# Patient Record
Sex: Female | Born: 1995 | Race: White | Hispanic: No | Marital: Single | Smoking: Never smoker
Health system: Southern US, Community
[De-identification: ages and names within clinical notes are randomized; demographics above are authoritative.]

## PROBLEM LIST (undated history)

## (undated) DIAGNOSIS — F909 Attention-deficit hyperactivity disorder, unspecified type: Secondary | ICD-10-CM

## (undated) DIAGNOSIS — F419 Anxiety disorder, unspecified: Secondary | ICD-10-CM

## (undated) HISTORY — DX: Anxiety disorder, unspecified: F41.9

## (undated) HISTORY — PX: CLEFT LIP REPAIR: SUR1164

## (undated) HISTORY — DX: Attention-deficit hyperactivity disorder, unspecified type: F90.9

---
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
## 2019-11-06 ENCOUNTER — Other Ambulatory Visit: Payer: Self-pay

## 2019-11-06 ENCOUNTER — Emergency Department (HOSPITAL_BASED_OUTPATIENT_CLINIC_OR_DEPARTMENT_OTHER): Payer: BC Managed Care – PPO

## 2019-11-06 ENCOUNTER — Emergency Department (INDEPENDENT_AMBULATORY_CARE_PROVIDER_SITE_OTHER): Payer: BC Managed Care – PPO

## 2019-11-06 ENCOUNTER — Encounter: Payer: Self-pay | Admitting: Emergency Medicine

## 2019-11-06 ENCOUNTER — Emergency Department (INDEPENDENT_AMBULATORY_CARE_PROVIDER_SITE_OTHER)
Admission: EM | Admit: 2019-11-06 | Discharge: 2019-11-06 | Disposition: A | Discharge status: Home / Self Care | Payer: Worker's Compensation | Source: Home / Self Care

## 2019-11-06 DIAGNOSIS — W01198A Fall on same level from slipping, tripping and stumbling with subsequent striking against other object, initial encounter: Secondary | ICD-10-CM

## 2019-11-06 DIAGNOSIS — W010XXA Fall on same level from slipping, tripping and stumbling without subsequent striking against object, initial encounter: Secondary | ICD-10-CM

## 2019-11-06 DIAGNOSIS — Y990 Civilian activity done for income or pay: Secondary | ICD-10-CM

## 2019-11-06 DIAGNOSIS — M25422 Effusion, left elbow: Secondary | ICD-10-CM

## 2019-11-06 DIAGNOSIS — M25522 Pain in left elbow: Secondary | ICD-10-CM | POA: Diagnosis not present

## 2019-11-06 DIAGNOSIS — S59902A Unspecified injury of left elbow, initial encounter: Secondary | ICD-10-CM

## 2019-11-06 MED ORDER — HYDROCODONE-ACETAMINOPHEN 5-325 MG PO TABS: 1.0000 | ORAL_TABLET | Freq: Four times a day (QID) | ORAL | 0 refills | Status: AC | PRN

## 2019-11-06 NOTE — Discharge Instructions (Signed)
  Norco/Vicodin (hydrocodone-acetaminophen) is a narcotic pain medication, do not combine these medications with others containing tylenol. While taking, do not drink alcohol, drive, or perform any other activities that requires focus while taking these medications.   Call to schedule a follow up appointment with Employee Health and Wellness if required by your employer for work related injury. It is recommended you schedule a follow up appointment with Sports Medicine or an Orthopedist in 7-10 day for repeat imaging of your elbow to help determine the severity of your injury.

## 2019-11-06 NOTE — ED Triage Notes (Signed)
Fell at work at 2130 on left arm - pain to left elbow Tylenol at 1100 (1000mg ) Pain also has pain to left FA Limited ROM

## 2019-11-06 NOTE — ED Provider Notes (Signed)
[facility]    CSN: [id number] Arrival date & time: 11/06/19  1109      History   Chief Complaint Chief Complaint  Patient presents with  . Appointment  . Arm Injury    left    HPI Kristin Washington is a 24 y.o. female.   HPI Kristin Washington is a 24 y.o. female presenting to UC with c/o Left elbow pain and swelling with slight decreased ROM after slip and fall at work last night around 9:30PM, hitting her elbow on a stone step.  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  Pain is aching and throbbing, 3/10.  She is Right hand dominant. Mild neck soreness from the fall but denies hitting her head or LOC.     Past Medical History:  Diagnosis Date  . ADHD (attention deficit hyperactivity disorder)   . Anxiety   . Migraine     Patient Active Problem List   Diagnosis Date Noted  . GAD (generalized anxiety disorder) 02/05/2011  . Obsessive-compulsive behavior 01/30/2011  . Adjustment disorder with disturbance of conduct 01/30/2011    Past Surgical History:  Procedure Laterality Date  . CLEFT LIP REPAIR      OB History   No obstetric history on file.      Home Medications    Prior to Admission medications   Medication Sig Start Date End Date Taking? Authorizing Provider  esomeprazole (NEXIUM) 20 MG capsule Take 20 mg by mouth daily at 12 noon.   Yes [provider]  amphetamine-dextroamphetamine (ADDERALL XR) 10 MG 24 hr capsule Take 1 capsule (10 mg total) by mouth every morning. 09/03/12   Elaina Pattee, MD  amphetamine-dextroamphetamine (ADDERALL XR) 10 MG 24 hr capsule Take 1 capsule (10 mg total) by mouth every morning. 09/27/12   Elaina Pattee, MD  amphetamine-dextroamphetamine (ADDERALL XR) 10 MG 24 hr capsule Take 1 capsule (10 mg total) by mouth every morning. Fill after 10/27/12 10/27/12   Elaina Pattee, MD  amphetamine-dextroamphetamine (ADDERALL XR) 10 MG 24 hr capsule Take 1 capsule (10 mg total) by mouth every morning. Fill after 11/26/12 11/26/12    Elaina Pattee, MD  clindamycin-benzoyl peroxide [facility]) gel  05/08/11   [provider]  FLUoxetine (PROZAC) 40 MG capsule Take 1 capsule (40 mg total) by mouth daily. 08/12/12   Elaina Pattee, MD  HYDROcodone-acetaminophen (NORCO/VICODIN) 5-325 MG tablet Take 1 tablet by mouth every 6 (six) hours as needed. 11/06/19   Lurene Shadow, PA-C    Family History Family History  Problem Relation Age of Onset  . Healthy Mother   . Healthy Father     Social History Social History   Tobacco Use  . Smoking status: Never Smoker  . Smokeless tobacco: Never Used  Substance Use Topics  . Alcohol use: No  . Drug use: No     Allergies   Patient has no known allergies.   Review of Systems Review of Systems  Musculoskeletal: Positive for arthralgias and joint swelling.  Skin: Negative for color change and wound.     Physical Exam Triage Vital Signs ED Triage Vitals  Enc Vitals Group     BP 11/06/19 1124 113/75     Pulse Rate 11/06/19 1124 86     Resp 11/06/19 1124 18     Temp 11/06/19 1124 98.8 F (37.1 C)     Temp Source 11/06/19 1124 Oral     SpO2 11/06/19 1124 97 %  Weight 11/06/19 1128 153 lb (69.4 kg)     Height 11/06/19 1128 5\' 7"  (1.702 m)     Head Circumference --      Peak Flow --      Pain Score 11/06/19 1128 3     Pain Loc --      Pain Edu? --      Excl. in GC? --    No data found.  Updated Vital Signs BP 113/75 (BP Location: Right Arm)   Pulse 86   Temp 98.8 F (37.1 C) (Oral)   Resp 18   Ht 5\' 7"  (1.702 m)   Wt 153 lb (69.4 kg)   LMP 10/31/2019 (Exact Date)   SpO2 97%   BMI 23.96 kg/m   Visual Acuity Right Eye Distance:   Left Eye Distance:   Bilateral Distance:    Right Eye Near:   Left Eye Near:    Bilateral Near:     Physical Exam Vitals and nursing note reviewed.  Constitutional:      Appearance: Normal appearance. She is well-developed.  HENT:     Head: Normocephalic and atraumatic.  Cardiovascular:     Rate and  Rhythm: Normal rate and regular rhythm.     Pulses:          Radial pulses are 2+ on the left side.  Pulmonary:     Effort: Pulmonary effort is normal.  Musculoskeletal:        General: Swelling and tenderness present.     Cervical back: Normal range of motion.     Comments: Left elbow: mild edema over olecranon. Tender. Full flexion with increased pain. Slight decreased extension.  Full ROM Left wrist, no tenderness to wrist or hand No tenderness to shoulder, full ROM No spinal tenderness.  Skin:    General: Skin is warm and dry.     Capillary Refill: Capillary refill takes less than 2 seconds.  Neurological:     Mental Status: She is alert and oriented to person, place, and time.     Sensory: No sensory deficit.  Psychiatric:        Behavior: Behavior normal.      UC Treatments / Results  Labs (all labs ordered are listed, but only abnormal results are displayed) Labs Reviewed - No data to display  EKG   Radiology DG Elbow Complete Left  Result Date: 11/06/2019 CLINICAL DATA:  LEFT elbow pain and swelling. Decreased range of motion. Patient fell. Pain in the posterior elbow. EXAM: LEFT ELBOW - COMPLETE 3+ VIEW COMPARISON:  None. FINDINGS: There is moderate joint effusion, suspicious for fracture. No displaced fracture identified. Soft tissues are otherwise unremarkable. IMPRESSION: Moderate joint effusion suspicious for occult fracture of the radial head in an adult. Recommend follow-up imaging in 07-10 days. Electronically Signed   By: 11/08/2019 M.D.   On: 11/06/2019 12:07    Procedures Splint Application  Date/Time: 11/06/2019 1:26 PM Performed by: 11/08/2019, PA-C Authorized by: 11/08/2019, PA-C   Consent:    Consent obtained:  Verbal   Consent given by:  Patient   Risks discussed:  Discoloration, numbness, swelling and pain   Alternatives discussed:  No treatment and delayed treatment Pre-procedure details:    Sensation:  Normal Procedure  details:    Laterality:  Left   Location:  Elbow   Elbow:  L elbow   Strapping: no     Splint type:  Long arm (posterior)   Supplies:  Elastic bandage,  Ortho-Glass, cotton padding and sling Post-procedure details:    Pain:  Unchanged   Sensation:  Normal   Patient tolerance of procedure:  Tolerated well, no immediate complications   (including critical care time)  Medications Ordered in UC Medications - No data to display  Initial Impression / Assessment and Plan / UC Course  I have reviewed the triage vital signs and the nursing notes.  Pertinent labs & imaging results that were available during my care of the patient were reviewed by me and considered in my medical decision making (see chart for details).     Reviewed imaging with pt Pt placed in posterior long-arm splint and sling provided, tx for possible occult radial head fracture Encouraged to f/u with employee health and sports medicine AVS given  Final Clinical Impressions(s) / UC Diagnoses   Final diagnoses:  Elbow injury, left, initial encounter  Effusion of left elbow  Fall from slip, trip, or stumble, initial encounter  Work related injury     Discharge Instructions      Norco/Vicodin (hydrocodone-acetaminophen) is a narcotic pain medication, do not combine these medications with others containing tylenol. While taking, do not drink alcohol, drive, or perform any other activities that requires focus while taking these medications.   Call to schedule a follow up appointment with Employee Health and Wellness if required by your employer for work related injury. It is recommended you schedule a follow up appointment with Sports Medicine or an Orthopedist in 7-10 day for repeat imaging of your elbow to help determine the severity of your injury.     ED Prescriptions    Medication Sig Dispense Auth. Provider   HYDROcodone-acetaminophen (NORCO/VICODIN) 5-325 MG tablet Take 1 tablet by mouth every 6 (six)  hours as needed. 6 tablet Lurene Shadow, [place]     I have reviewed the PDMP during this encounter.   Lurene Shadow, [place] 11/06/19 1327

## 2019-11-17 ENCOUNTER — Ambulatory Visit (INDEPENDENT_AMBULATORY_CARE_PROVIDER_SITE_OTHER): Payer: BC Managed Care – PPO

## 2019-11-17 ENCOUNTER — Other Ambulatory Visit: Payer: Self-pay

## 2019-11-17 ENCOUNTER — Ambulatory Visit (INDEPENDENT_AMBULATORY_CARE_PROVIDER_SITE_OTHER): Payer: BC Managed Care – PPO | Admitting: Sports Medicine

## 2019-11-17 DIAGNOSIS — M25422 Effusion, left elbow: Secondary | ICD-10-CM | POA: Diagnosis not present

## 2019-11-17 DIAGNOSIS — S42402A Unspecified fracture of lower end of left humerus, initial encounter for closed fracture: Secondary | ICD-10-CM

## 2019-11-17 NOTE — Progress Notes (Signed)
    Procedures performed today:    None.  Independent interpretation of notes and tests performed by another provider:   I did personally review the x-rays which do show a hemarthrosis, I do see a lucency through the head of the radius, nondisplaced, nonangulated.  Brief History, Exam, Impression, and Recommendations:    Occult fracture of left elbow This is a pleasant 24 year old female, 11 days ago she slipped and fell, had immediate pain in her elbow, lateral aspect, she was seen in urgent care where x-rays did not show an obvious fracture, she did have hemarthrosis suspicious for an occult fracture. She was placed in a posterior slab splint, sling, and referred to me. Today her pain is okay, she does have some reproduction of her discomfort with pronation and supination suggesting a radial head fracture. Discontinue posterior slab splint, sling alone, we will do this for an additional week before discontinuing sling and allowing her to regain motion. Repeat x-rays today. Return to see me in a month.    ___________________________________________ Ihor Austin. Benjamin Stain, M.D., ABFM., CAQSM. Primary Care and Sports Medicine [facility]  Adjunct Instructor of Family Medicine  [employer]

## 2019-11-17 NOTE — Assessment & Plan Note (Signed)
This is a pleasant 24 year old female, 11 days ago she slipped and fell, had immediate pain in her elbow, lateral aspect, she was seen in urgent care where x-rays did not show an obvious fracture, she did have hemarthrosis suspicious for an occult fracture. She was placed in a posterior slab splint, sling, and referred to me. Today her pain is okay, she does have some reproduction of her discomfort with pronation and supination suggesting a radial head fracture. Discontinue posterior slab splint, sling alone, we will do this for an additional week before discontinuing sling and allowing her to regain motion. Repeat x-rays today. Return to see me in a month.

## 2019-12-19 ENCOUNTER — Ambulatory Visit (INDEPENDENT_AMBULATORY_CARE_PROVIDER_SITE_OTHER): Payer: BC Managed Care – PPO | Admitting: Sports Medicine

## 2019-12-19 ENCOUNTER — Other Ambulatory Visit: Payer: Self-pay

## 2019-12-19 DIAGNOSIS — S42402D Unspecified fracture of lower end of left humerus, subsequent encounter for fracture with routine healing: Secondary | ICD-10-CM

## 2019-12-19 NOTE — Assessment & Plan Note (Signed)
This is a very pleasant 24 year old female, she is now about 5 to 6 weeks post injury of her left elbow, ultimately she was seen in urgent care, she had pain laterally, x-rays did not show an obvious fracture but she did have a hemarthrosis suggestive of an occult fracture. Placed in a posterior slab splint, sling and referred to me, when I saw her we discontinued her splint, continued sling. Repeated x-rays which showed improvement of the hemarthrosis. She did have some pain with pronation supination at the radial head as suspected. Today she is doing extremely well, no tenderness to palpation, full strength, full motion to flexion, extension, pronation, supination, collaterals are stable, discontinue sling, return as needed.

## 2019-12-19 NOTE — Progress Notes (Signed)
    Procedures performed today:    None.  Independent interpretation of notes and tests performed by another provider:   None.  Brief History, Exam, Impression, and Recommendations:    Occult fracture of left elbow This is a very pleasant 24 year old female, she is now about 5 to 6 weeks post injury of her left elbow, ultimately she was seen in urgent care, she had pain laterally, x-rays did not show an obvious fracture but she did have a hemarthrosis suggestive of an occult fracture. Placed in a posterior slab splint, sling and referred to me, when I saw her we discontinued her splint, continued sling. Repeated x-rays which showed improvement of the hemarthrosis. She did have some pain with pronation supination at the radial head as suspected. Today she is doing extremely well, no tenderness to palpation, full strength, full motion to flexion, extension, pronation, supination, collaterals are stable, discontinue sling, return as needed.    ___________________________________________ Ihor Austin. Benjamin Stain, M.D., ABFM., CAQSM. Primary Care and Sports Medicine [facility]  Adjunct Instructor of Family Medicine  [employer]

## 2022-06-03 IMAGING — DX DG ELBOW COMPLETE 3+V*L*
4 series · 4 of 4 positions shown · non-contrast
Comparison: 11/06/2019

CLINICAL DATA: Fell 2 weeks ago, radial head fracture

EXAM:
LEFT ELBOW - COMPLETE 3+ VIEW

[elbow ap]
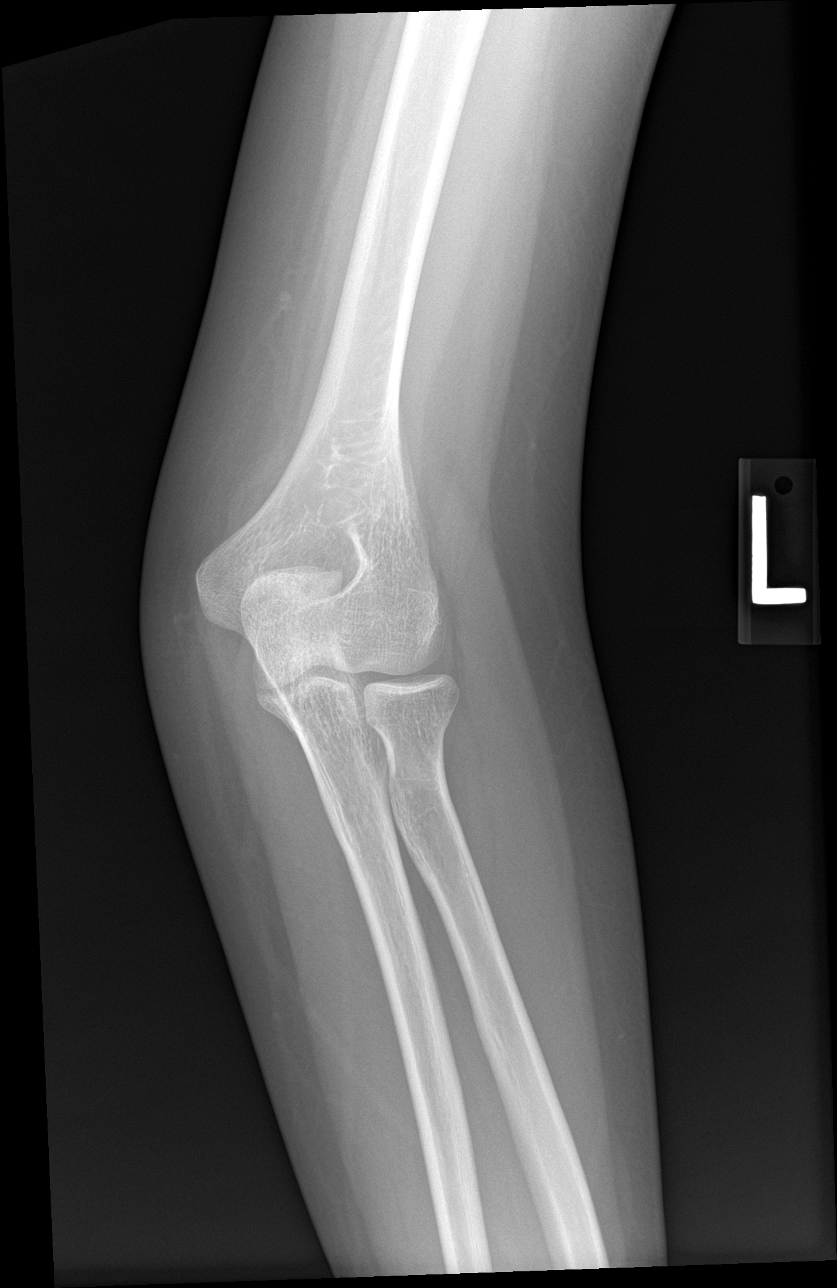

[elbow obl (1 of 2)]
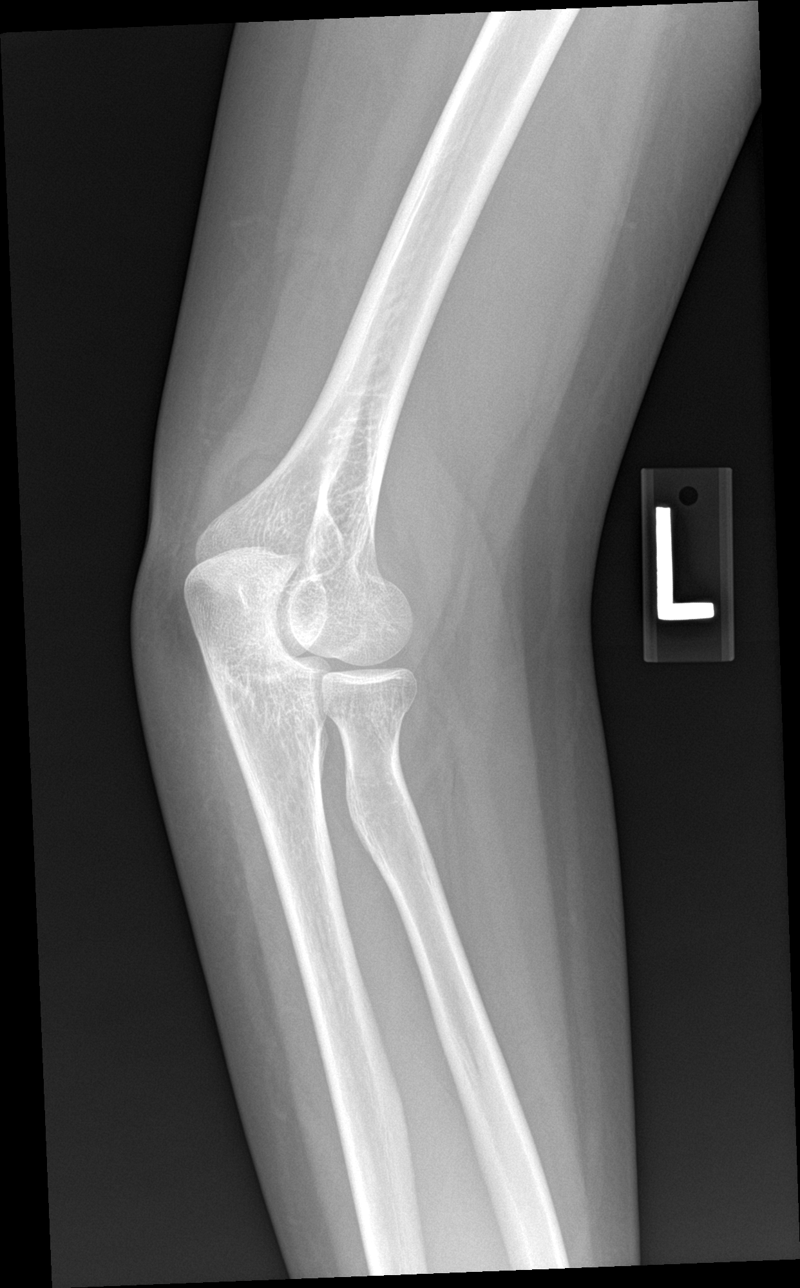

[elbow obl (2 of 2)]
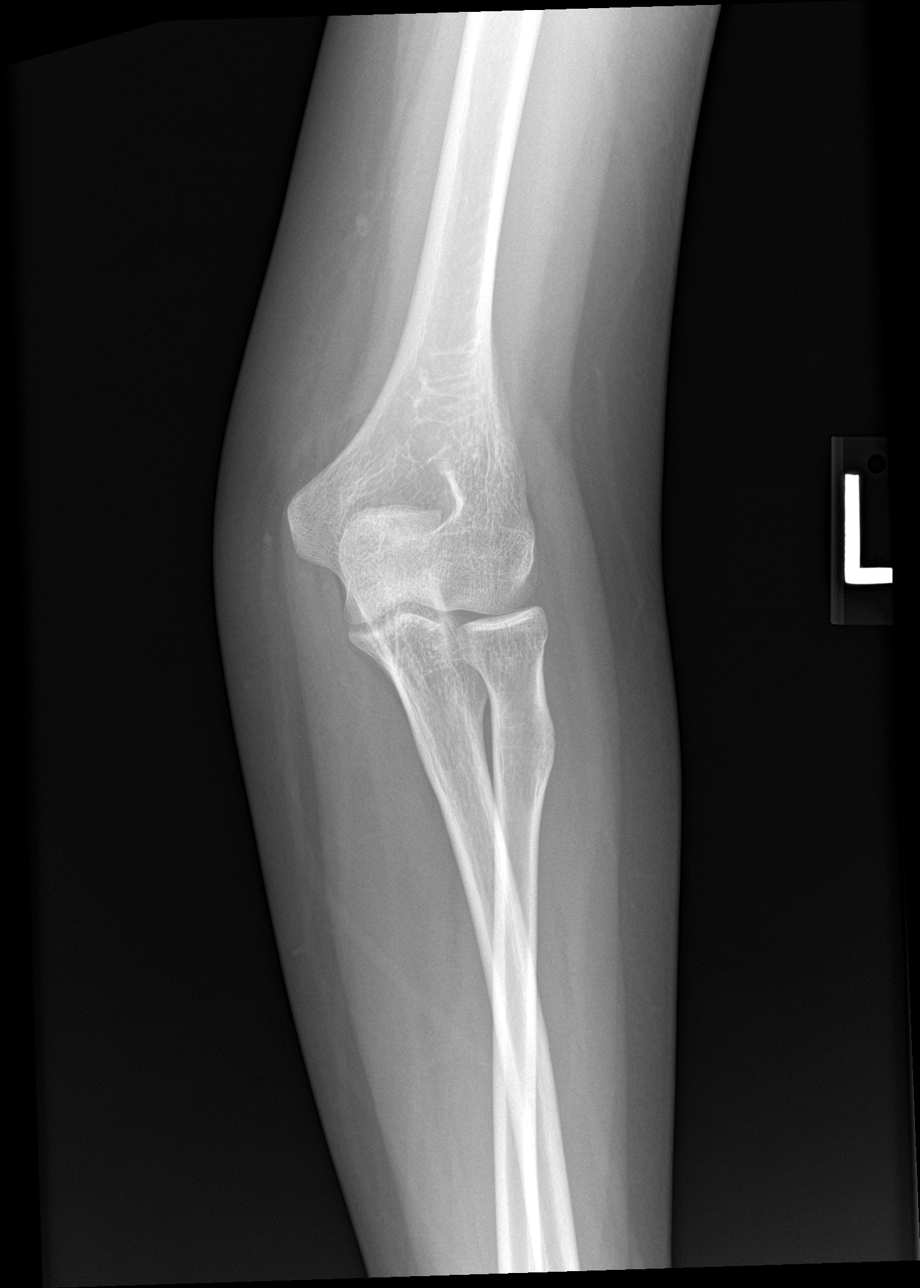

[elbow lat]
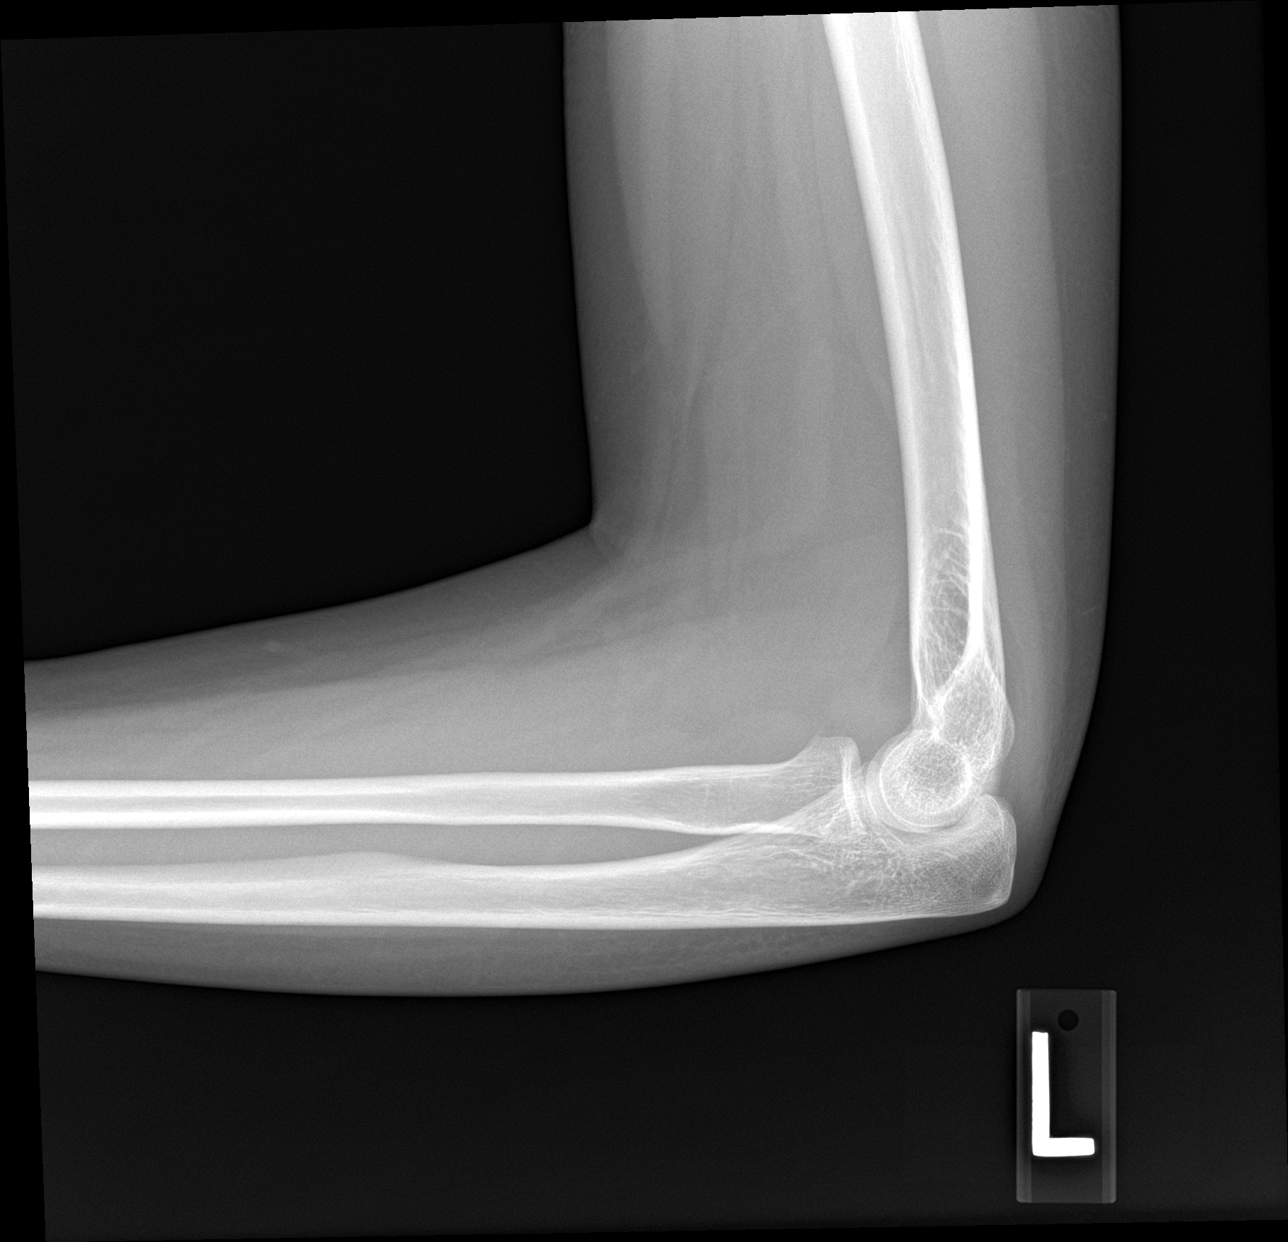

[4 of 4 positions shown; findings below may reference images not displayed]

FINDINGS: Frontal, bilateral oblique, and lateral views of the left elbow are
obtained. The joint effusions seen previously is decreased in size.
There are no acute displaced fractures. Alignment is anatomic. Joint
spaces are well preserved.
IMPRESSION: 1. Decreased size of the left elbow effusion. No evidence of
fracture.
